# Patient Record
Sex: Male | Born: 1975
Health system: Southern US, Community
[De-identification: ages and names within clinical notes are randomized; demographics above are authoritative.]

## PROBLEM LIST (undated history)

## (undated) DIAGNOSIS — I10 Essential (primary) hypertension: Secondary | ICD-10-CM

## (undated) DIAGNOSIS — T7840XA Allergy, unspecified, initial encounter: Secondary | ICD-10-CM

## (undated) DIAGNOSIS — E785 Hyperlipidemia, unspecified: Secondary | ICD-10-CM

## (undated) DIAGNOSIS — J45909 Unspecified asthma, uncomplicated: Secondary | ICD-10-CM

## (undated) HISTORY — DX: Allergy, unspecified, initial encounter: T78.40XA

## (undated) HISTORY — DX: Hyperlipidemia, unspecified: E78.5

## (undated) HISTORY — DX: Essential (primary) hypertension: I10

## (undated) HISTORY — DX: Unspecified asthma, uncomplicated: J45.909

---

## 2009-06-12 ENCOUNTER — Ambulatory Visit (HOSPITAL_COMMUNITY): Admission: RE | Admit: 2009-06-12 | Discharge: 2009-06-12 | Payer: Self-pay | Admitting: Chiropractic Medicine

## 2011-01-05 IMAGING — CR DG CERVICAL SPINE COMPLETE 4+V
7 series · 7 of 7 positions shown · non-contrast
Comparison: None.

CLINICAL DATA: Neck pain.

CERVICAL SPINE - COMPLETE 4+ VIEW

[w c-spine lat]
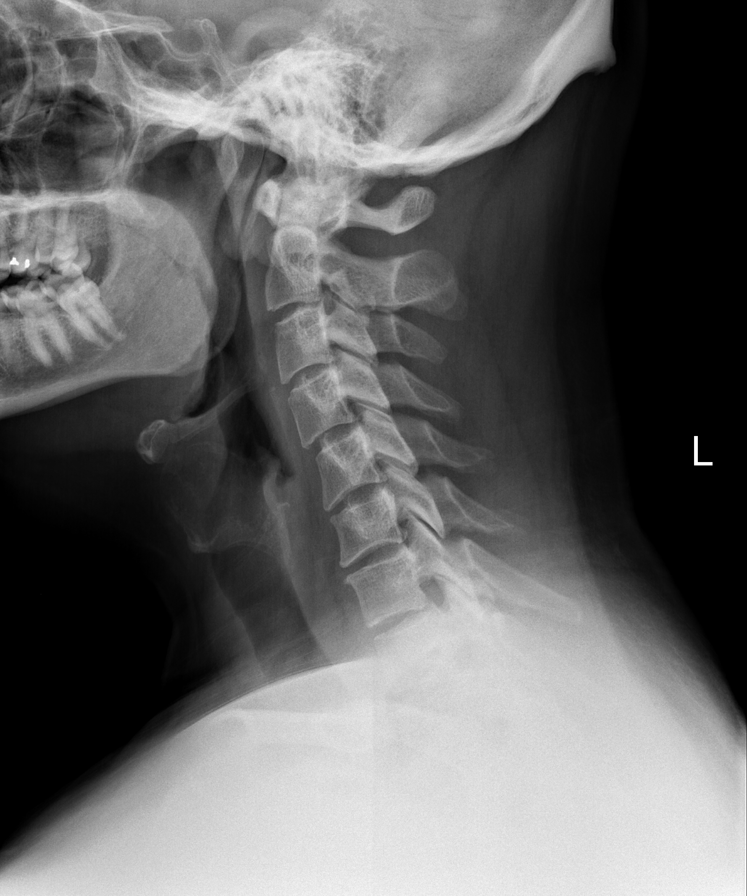

[w c-spine oblique (1 of 2)]
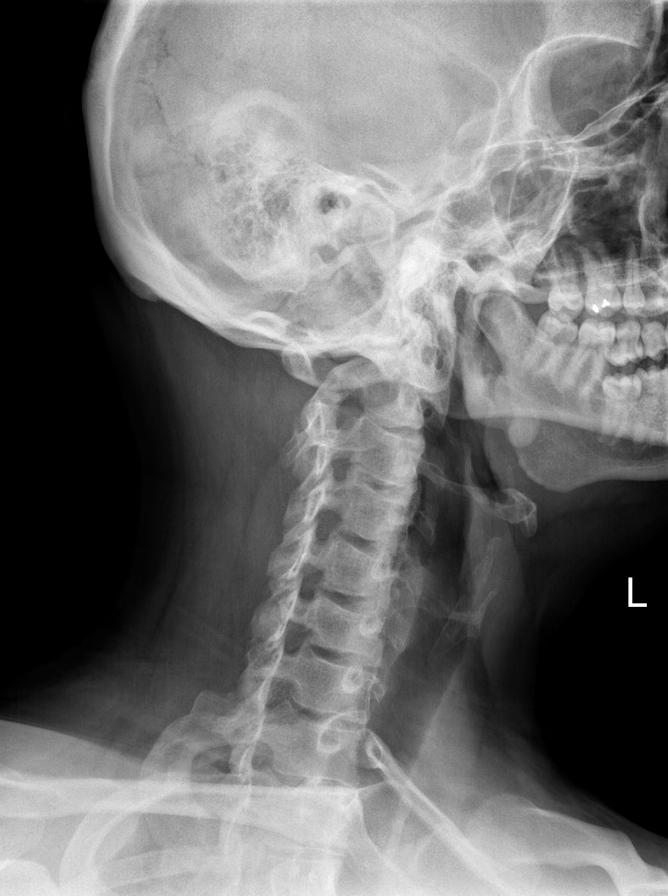

[w c-spine oblique (2 of 2)]
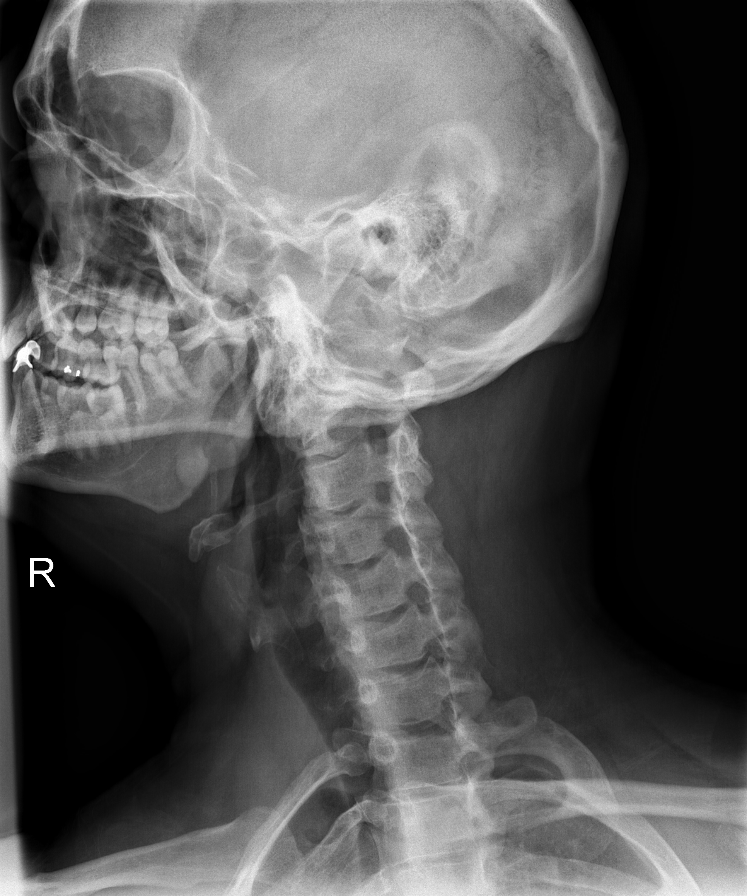

[w c-spine a.p. *]
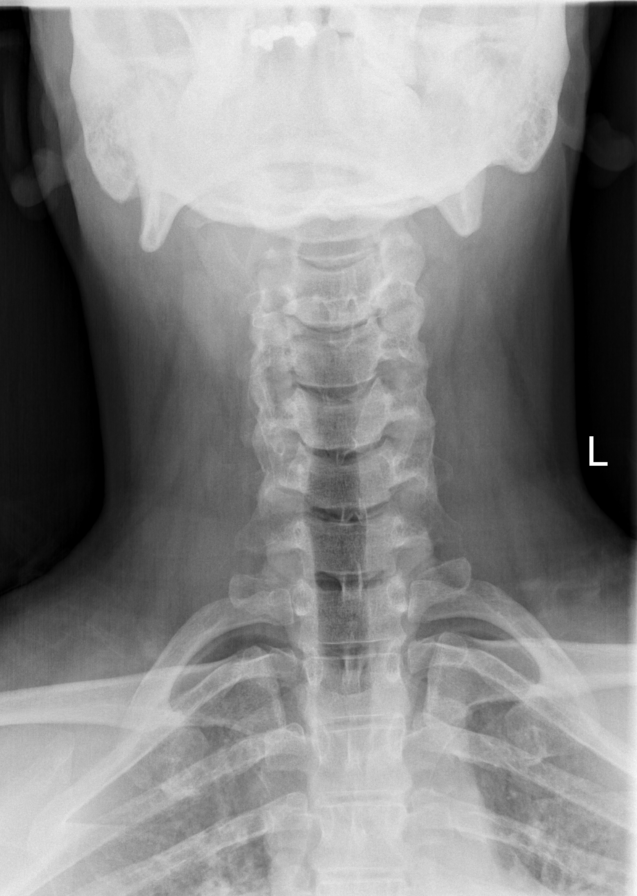

[w c-spine odontoid * (1 of 2)]
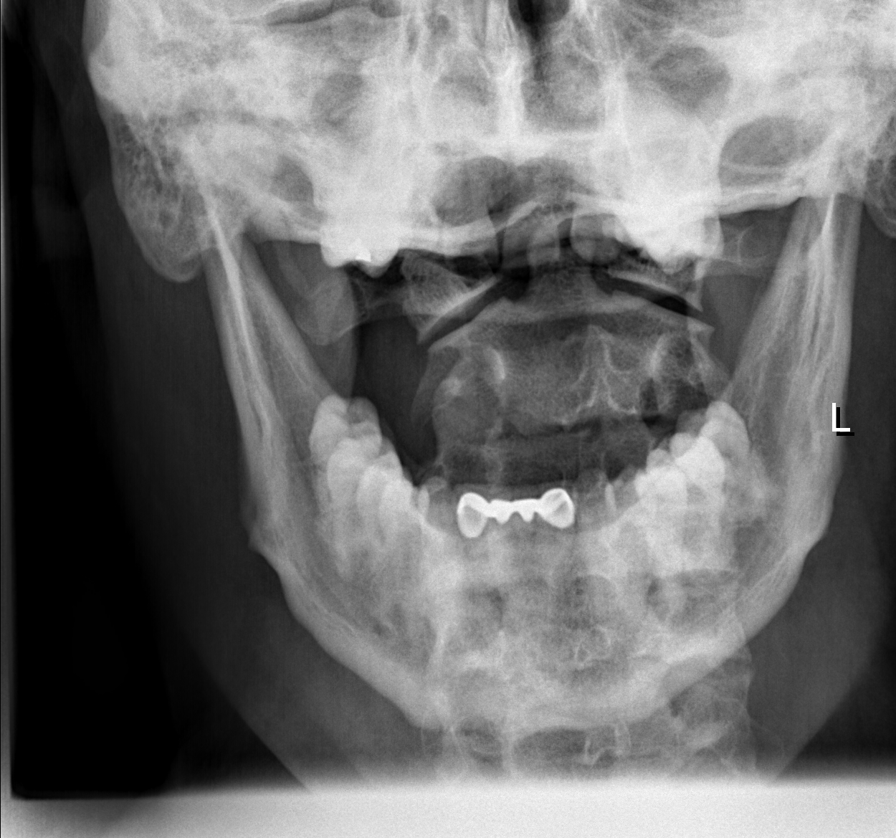

[w c-spine odontoid * (2 of 2)]
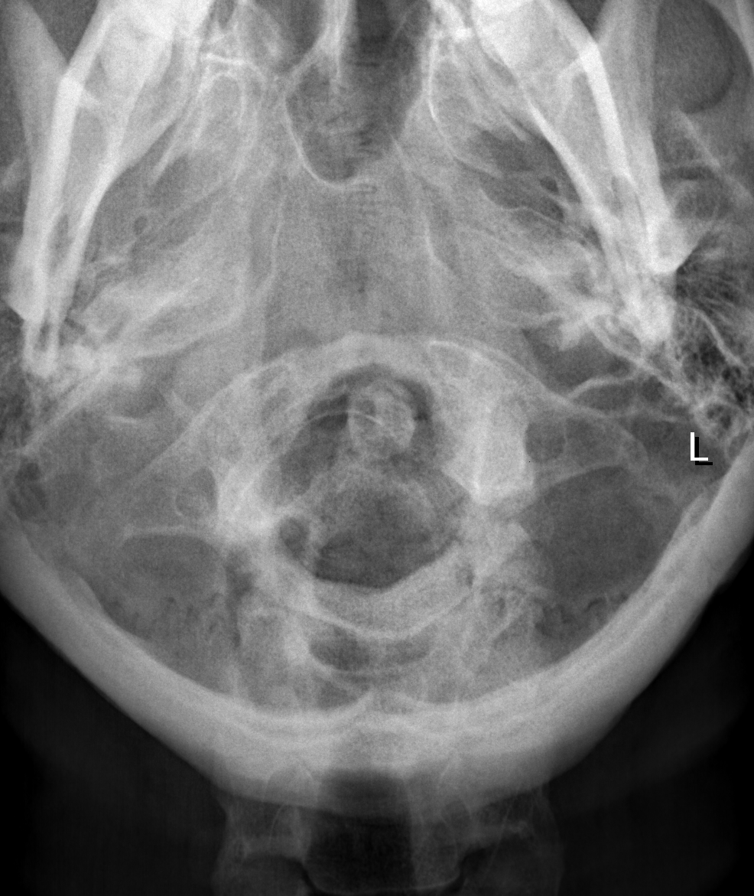

[w swimmers view *]
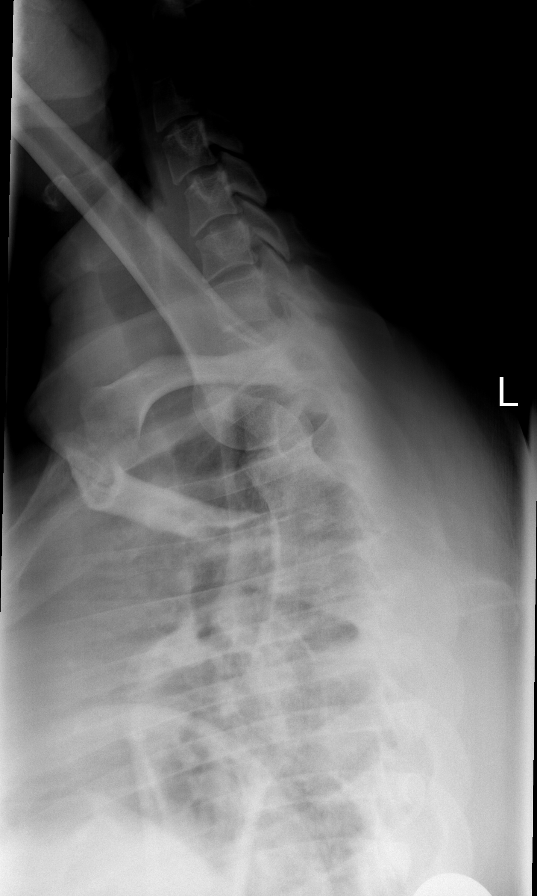

[7 of 7 positions shown; findings below may reference images not displayed]

FINDINGS: Cervical spine is visualized from the occiput to the C7-
T1 junction.  There is straightening of the normal cervical
lordosis.  Prevertebral soft tissues are within normal limits.  No
subluxation or fracture.  Mild multilevel uncovertebral hypertrophy
and facet sclerosis.  Mild loss of disc space height is seen at
multiple levels as well.  Visualized neural foramina appear patent.
Dens is partially obscured on the dedicated views.
IMPRESSION: Straightening of the normal cervical lordosis with mild
spondylosis.

## 2012-02-03 ENCOUNTER — Other Ambulatory Visit: Payer: Self-pay | Admitting: Physician Assistant

## 2012-02-05 ENCOUNTER — Other Ambulatory Visit: Payer: Self-pay | Admitting: Physician Assistant

## 2012-03-02 ENCOUNTER — Other Ambulatory Visit: Payer: Self-pay | Admitting: Family Medicine

## 2012-03-29 ENCOUNTER — Other Ambulatory Visit: Payer: Self-pay | Admitting: Family Medicine

## 2012-05-06 ENCOUNTER — Other Ambulatory Visit: Payer: Self-pay | Admitting: Physician Assistant

## 2012-07-14 ENCOUNTER — Ambulatory Visit (INDEPENDENT_AMBULATORY_CARE_PROVIDER_SITE_OTHER): Payer: Commercial Managed Care - PPO | Admitting: Family Medicine

## 2012-07-14 ENCOUNTER — Encounter: Payer: Self-pay | Admitting: Family Medicine

## 2012-07-14 VITALS — BP 121/85 | HR 76 | Temp 98.2°F | Resp 16 | Ht 66.5 in | Wt 240.0 lb

## 2012-07-14 DIAGNOSIS — I10 Essential (primary) hypertension: Secondary | ICD-10-CM | POA: Insufficient documentation

## 2012-07-14 DIAGNOSIS — E785 Hyperlipidemia, unspecified: Secondary | ICD-10-CM

## 2012-07-14 DIAGNOSIS — J45909 Unspecified asthma, uncomplicated: Secondary | ICD-10-CM

## 2012-07-14 DIAGNOSIS — Z23 Encounter for immunization: Secondary | ICD-10-CM

## 2012-07-14 DIAGNOSIS — J309 Allergic rhinitis, unspecified: Secondary | ICD-10-CM

## 2012-07-14 LAB — CBC
HCT: 46.9 % (ref 39.0–52.0)
Hemoglobin: 16.4 g/dL (ref 13.0–17.0)
MCH: 30.8 pg (ref 26.0–34.0)
MCHC: 35 g/dL (ref 30.0–36.0)
MCV: 88.2 fL (ref 78.0–100.0)
Platelets: 192 10*3/uL (ref 150–400)
RBC: 5.32 MIL/uL (ref 4.22–5.81)
RDW: 13.3 % (ref 11.5–15.5)
WBC: 4.5 10*3/uL (ref 4.0–10.5)

## 2012-07-14 LAB — COMPREHENSIVE METABOLIC PANEL
ALT: 50 U/L (ref 0–53)
AST: 35 U/L (ref 0–37)
Albumin: 4.5 g/dL (ref 3.5–5.2)
Alkaline Phosphatase: 57 U/L (ref 39–117)
BUN: 12 mg/dL (ref 6–23)
CO2: 27 mEq/L (ref 19–32)
Calcium: 9.4 mg/dL (ref 8.4–10.5)
Chloride: 105 mEq/L (ref 96–112)
Creat: 1.08 mg/dL (ref 0.50–1.35)
Glucose, Bld: 85 mg/dL (ref 70–99)
Potassium: 3.9 mEq/L (ref 3.5–5.3)
Sodium: 141 mEq/L (ref 135–145)
Total Bilirubin: 0.9 mg/dL (ref 0.3–1.2)
Total Protein: 6.7 g/dL (ref 6.0–8.3)

## 2012-07-14 LAB — LIPID PANEL
Cholesterol: 174 mg/dL (ref 0–200)
HDL: 47 mg/dL (ref >39)
LDL Cholesterol: 113 mg/dL — ABNORMAL HIGH (ref 0–99)
Total CHOL/HDL Ratio: 3.7 Ratio
Triglycerides: 71 mg/dL (ref <150)
VLDL: 14 mg/dL (ref 0–40)

## 2012-07-14 MED ORDER — FLUTICASONE-SALMETEROL 100-50 MCG/DOSE IN AEPB: 1.0000 | INHALATION_SPRAY | Freq: Two times a day (BID) | RESPIRATORY_TRACT | Status: DC

## 2012-07-14 MED ORDER — ALBUTEROL SULFATE HFA 108 (90 BASE) MCG/ACT IN AERS: 2.0000 | INHALATION_SPRAY | Freq: Four times a day (QID) | RESPIRATORY_TRACT | Status: DC | PRN

## 2012-07-14 MED ORDER — PRAVASTATIN SODIUM 20 MG PO TABS: 20.0000 mg | ORAL_TABLET | Freq: Every day | ORAL | Status: DC

## 2012-07-14 MED ORDER — PNEUMOCOCCAL VAC POLYVALENT 25 MCG/0.5ML IJ INJ: 0.5000 mL | INJECTION | INTRAMUSCULAR | Status: DC

## 2012-07-14 MED ORDER — AMLODIPINE BESYLATE 5 MG PO TABS: 5.0000 mg | ORAL_TABLET | Freq: Every day | ORAL | Status: DC

## 2012-07-14 MED ORDER — TRIAMCINOLONE ACETONIDE(NASAL) 55 MCG/ACT NA INHA: 2.0000 | Freq: Every day | NASAL | Status: DC

## 2012-07-14 NOTE — Progress Notes (Signed)
36 yo man   Lost 12 lbs since July:  1800 calories per day and walking 5 miles daily with wife.   Breathing: needs rescue inhaler (used it three times this summer).  Generally, the Advair is very effective.   Gets flu shot at hospital   Does get some edema with the amlodipine but the exercise is helping mobilize the fluid.  Blood pressure is controlled at home.  Objective:  NAD Peak flow 570 HEENT:  Unremarkable, including fundi Neck:  No adenopathy Chest: faint wheeze heard at first, clears after lying down and sitting back up Heart: reg, no murmur Abdomen: soft, nontender without HSM or mass.   Assessment: doing well. The exercising is especially effective with the weight loss.  1. Asthma  albuterol (PROVENTIL HFA;VENTOLIN HFA) 108 (90 BASE) MCG/ACT inhaler, Fluticasone-Salmeterol (ADVAIR DISKUS) 100-50 MCG/DOSE AEPB, pneumococcal 23 valent vaccine (PNU-IMMUNE) injection 0.5 mL  2. Allergic rhinitis  triamcinolone (NASACORT) 55 MCG/ACT nasal inhaler  3. Hyperlipidemia  pravastatin (PRAVACHOL) 20 MG tablet, Comprehensive metabolic panel, Lipid panel, CBC  4. Hypertension  amLODipine (NORVASC) 5 MG tablet   [email or web address]

## 2012-07-14 NOTE — Patient Instructions (Addendum)

## 2012-07-15 ENCOUNTER — Telehealth: Payer: Self-pay | Admitting: Family Medicine

## 2012-07-15 NOTE — Telephone Encounter (Signed)
Spoke with patient on phone about normal labs

## 2012-09-14 ENCOUNTER — Other Ambulatory Visit: Payer: Self-pay | Admitting: Family Medicine

## 2012-09-29 ENCOUNTER — Other Ambulatory Visit: Payer: Self-pay | Admitting: Family Medicine

## 2013-01-12 ENCOUNTER — Encounter: Payer: Self-pay | Admitting: Family Medicine

## 2013-01-12 ENCOUNTER — Ambulatory Visit (INDEPENDENT_AMBULATORY_CARE_PROVIDER_SITE_OTHER): Payer: Commercial Managed Care - PPO | Admitting: Family Medicine

## 2013-01-12 VITALS — BP 114/80 | HR 69 | Temp 97.1°F | Resp 16 | Ht 66.5 in | Wt 227.0 lb

## 2013-01-12 DIAGNOSIS — E785 Hyperlipidemia, unspecified: Secondary | ICD-10-CM

## 2013-01-12 DIAGNOSIS — J45909 Unspecified asthma, uncomplicated: Secondary | ICD-10-CM

## 2013-01-12 DIAGNOSIS — J309 Allergic rhinitis, unspecified: Secondary | ICD-10-CM

## 2013-01-12 LAB — LIPID PANEL
Cholesterol: 191 mg/dL (ref 0–200)
HDL: 51 mg/dL (ref >39)
LDL Cholesterol: 120 mg/dL — ABNORMAL HIGH (ref 0–99)
Total CHOL/HDL Ratio: 3.7 Ratio
Triglycerides: 99 mg/dL (ref <150)
VLDL: 20 mg/dL (ref 0–40)

## 2013-01-12 LAB — COMPREHENSIVE METABOLIC PANEL
ALT: 33 U/L (ref 0–53)
AST: 30 U/L (ref 0–37)
Albumin: 4.4 g/dL (ref 3.5–5.2)
Alkaline Phosphatase: 59 U/L (ref 39–117)
BUN: 11 mg/dL (ref 6–23)
CO2: 29 mEq/L (ref 19–32)
Calcium: 9.5 mg/dL (ref 8.4–10.5)
Chloride: 102 mEq/L (ref 96–112)
Creat: 1.03 mg/dL (ref 0.50–1.35)
Glucose, Bld: 98 mg/dL (ref 70–99)
Potassium: 4.4 mEq/L (ref 3.5–5.3)
Sodium: 137 mEq/L (ref 135–145)
Total Bilirubin: 1.1 mg/dL (ref 0.3–1.2)
Total Protein: 6.9 g/dL (ref 6.0–8.3)

## 2013-01-12 MED ORDER — ALBUTEROL SULFATE HFA 108 (90 BASE) MCG/ACT IN AERS: 2.0000 | INHALATION_SPRAY | Freq: Four times a day (QID) | RESPIRATORY_TRACT | Status: DC | PRN

## 2013-01-12 MED ORDER — FLUTICASONE-SALMETEROL 100-50 MCG/DOSE IN AEPB: 1.0000 | INHALATION_SPRAY | Freq: Two times a day (BID) | RESPIRATORY_TRACT | Status: DC

## 2013-01-12 MED ORDER — PRAVASTATIN SODIUM 20 MG PO TABS: 20.0000 mg | ORAL_TABLET | Freq: Every day | ORAL | Status: DC

## 2013-01-12 MED ORDER — TRIAMCINOLONE ACETONIDE(NASAL) 55 MCG/ACT NA INHA: 2.0000 | Freq: Every day | NASAL | Status: DC

## 2013-01-12 NOTE — Progress Notes (Signed)
Patient ID: Christopher Hahn MRN: [id number], DOB: 03/15/1976, 37 y.o. Date of Encounter: 01/12/2013, 8:17 AM  Primary Physician: No primary provider on file.  Chief Complaint: HTN  HPI: 37 y.o. year old male with history below presents for hypertension follow up.  Doing pilates.  Lost weight! Asthma:  Well controlled on the advair ([place], [place], dry heat).  Only uses albuterol rarely now.  No CP, HA, visual changes, or focal deficits.   Past Medical History  Diagnosis Date  . Allergy   . Asthma   . Hypertension   . Hyperlipidemia      Home Meds: Prior to Admission medications   Medication Sig Start Date End Date Taking? Authorizing Provider  albuterol (PROVENTIL HFA;VENTOLIN HFA) 108 (90 BASE) MCG/ACT inhaler Inhale 2 puffs into the lungs every 6 (six) hours as needed. 01/12/13  Yes Elvina Sidle, MD  Fluticasone-Salmeterol (ADVAIR DISKUS) 100-50 MCG/DOSE AEPB Inhale 1 puff into the lungs 2 (two) times daily. 01/12/13  Yes Elvina Sidle, MD  pravastatin (PRAVACHOL) 20 MG tablet Take 1 tablet (20 mg total) by mouth daily. 01/12/13  Yes Elvina Sidle, MD  triamcinolone (NASACORT) 55 MCG/ACT nasal inhaler Place 2 sprays into the nose daily. 01/12/13  Yes Elvina Sidle, MD    Allergies:  Allergies  Allergen Reactions  . Penicillins Anaphylaxis    History   Social History  . Marital Status: Single    Spouse Name: N/A    Number of Children: N/A  . Years of Education: N/A   Occupational History  . Not on file.   Social History Main Topics  . Smoking status: Never Smoker   . Smokeless tobacco: Not on file  . Alcohol Use: Not on file  . Drug Use: Not on file  . Sexually Active: Not on file   Other Topics Concern  . Not on file   Social History Narrative  . No narrative on file     Family History  Problem Relation Age of Onset  . Heart disease Father   . Hypertension Brother     Review of Systems: Constitutional: negative for chills, fever,  night sweats, weight changes, or fatigue  HEENT: negative for vision changes, hearing loss, congestion, rhinorrhea, ST, epistaxis, or sinus pressure Cardiovascular: negative for chest pain, palpitations, or DOE Respiratory: negative for hemoptysis, wheezing, shortness of breath, or cough Abdominal: negative for abdominal pain, nausea, vomiting, diarrhea, or constipation Dermatological: negative for rash Neurologic: negative for headache, dizziness, or syncope All other systems reviewed and are otherwise negative with the exception to those above and in the HPI.   Physical Exam: Blood pressure 114/80, pulse 69, temperature 97.1 F (36.2 C), temperature source Oral, resp. rate 16, height 5' 6.5" (1.689 m), weight 227 lb (102.967 kg)., Body mass index is 36.09 kg/(m^2). General: Well developed, well nourished, in no acute distress. Head: Normocephalic, atraumatic, eyes without discharge, sclera non-icteric, nares are without discharge. Bilateral auditory canals clear, TM's are without perforation, pearly grey and translucent with reflective cone of light bilaterally. Oral cavity moist, posterior pharynx without exudate, erythema, peritonsillar abscess, or post nasal drip.  Neck: Supple. No thyromegaly. Full ROM. No lymphadenopathy. No carotid bruits. Lungs: Clear bilaterally to auscultation without wheezes, rales, or rhonchi. Breathing is unlabored. Heart: RRR with S1 S2. No murmurs, rubs, or gallops appreciated.  Abdomen: Soft, non-tender, non-distended with normoactive bowel sounds. No hepatosplenomegaly. No rebound/guarding. No obvious abdominal masses. Msk:  Strength and tone normal for age. Extremities/Skin: Warm and dry.  No clubbing or cyanosis. No edema. No rashes or suspicious lesions. Distal pulses 2+ and equal bilaterally. Neuro: Alert and oriented X 3. Moves all extremities spontaneously. Gait is normal. CNII-XII grossly in tact. DTR 2+, cerebellar function intact. Rhomberg  normal. Psych:  Responds to questions appropriately with a normal affect.    CMP pending  ASSESSMENT AND PLAN:  37 y.o. year old male with past htn, now seems resolved.  Asthma controlled.  Hyperlipidemia to be determined.  No adverse effects from meds Asthma - Plan: albuterol (PROVENTIL HFA;VENTOLIN HFA) 108 (90 BASE) MCG/ACT inhaler, Fluticasone-Salmeterol (ADVAIR DISKUS) 100-50 MCG/DOSE AEPB  Hyperlipidemia - Plan: pravastatin (PRAVACHOL) 20 MG tablet, Comprehensive metabolic panel, Lipid panel  Allergic rhinitis - Plan: triamcinolone (NASACORT) 55 MCG/ACT nasal inhaler   -  Signed, Elvina Sidle, MD 01/12/2013 8:17 AM

## 2013-01-18 NOTE — Progress Notes (Signed)
Received denial for prior auth of Advair. Dr Milus Glazier asked me to appeal the decision. Faxed appeal letter to [employer] for expedited appeal.

## 2013-01-23 ENCOUNTER — Telehealth: Payer: Self-pay

## 2013-01-23 MED ORDER — MOMETASONE FURO-FORMOTEROL FUM 100-5 MCG/ACT IN AERO: 2.0000 | INHALATION_SPRAY | RESPIRATORY_TRACT | Status: DC

## 2013-01-23 NOTE — Telephone Encounter (Signed)
Thanks, left message for him to advise.

## 2013-01-23 NOTE — Telephone Encounter (Signed)
dulera should be covered, can we change? pended

## 2013-01-23 NOTE — Telephone Encounter (Signed)
PT STATES HE RECEIVED A LETTER STATING HIS INSURANCE WILL NO LONGER COVER THE ADVAIR AND WOULD LIKE TO KNOW WHAT WILL THEY COVER THAT'S IN THE FAMILY. PLEASE CALL [phone]

## 2013-01-23 NOTE — Telephone Encounter (Signed)
Medication changed and sent in

## 2013-01-26 NOTE — Progress Notes (Signed)
Received denial of appeal. Rx for [facility] already sent in.

## 2013-08-31 ENCOUNTER — Other Ambulatory Visit: Payer: Self-pay

## 2014-01-11 ENCOUNTER — Other Ambulatory Visit: Payer: Self-pay | Admitting: Radiology

## 2014-01-11 ENCOUNTER — Ambulatory Visit (INDEPENDENT_AMBULATORY_CARE_PROVIDER_SITE_OTHER): Payer: Commercial Managed Care - PPO | Admitting: Family Medicine

## 2014-01-11 ENCOUNTER — Encounter: Payer: Self-pay | Admitting: Family Medicine

## 2014-01-11 VITALS — BP 128/100 | HR 70 | Temp 98.4°F | Resp 16 | Ht 67.0 in | Wt 229.0 lb

## 2014-01-11 DIAGNOSIS — I10 Essential (primary) hypertension: Secondary | ICD-10-CM

## 2014-01-11 DIAGNOSIS — E785 Hyperlipidemia, unspecified: Secondary | ICD-10-CM

## 2014-01-11 DIAGNOSIS — J45909 Unspecified asthma, uncomplicated: Secondary | ICD-10-CM

## 2014-01-11 DIAGNOSIS — J309 Allergic rhinitis, unspecified: Secondary | ICD-10-CM

## 2014-01-11 LAB — POCT URINALYSIS DIPSTICK
Bilirubin, UA: NEGATIVE
Blood, UA: NEGATIVE
Glucose, UA: NEGATIVE
Ketones, UA: NEGATIVE
Leukocytes, UA: NEGATIVE
Nitrite, UA: NEGATIVE
Protein, UA: NEGATIVE
Spec Grav, UA: 1.01
Urobilinogen, UA: 0.2
pH, UA: 7

## 2014-01-11 LAB — CBC WITH DIFFERENTIAL/PLATELET
Basophils Absolute: 0.1 10*3/uL (ref 0.0–0.1)
Basophils Relative: 1 % (ref 0–1)
Eosinophils Absolute: 0.6 10*3/uL (ref 0.0–0.7)
Eosinophils Relative: 10 % — ABNORMAL HIGH (ref 0–5)
HCT: 49.2 % (ref 39.0–52.0)
Hemoglobin: 17.4 g/dL — ABNORMAL HIGH (ref 13.0–17.0)
Lymphocytes Relative: 24 % (ref 12–46)
Lymphs Abs: 1.3 10*3/uL (ref 0.7–4.0)
MCH: 31.1 pg (ref 26.0–34.0)
MCHC: 35.4 g/dL (ref 30.0–36.0)
MCV: 88 fL (ref 78.0–100.0)
Monocytes Absolute: 0.4 10*3/uL (ref 0.1–1.0)
Monocytes Relative: 8 % (ref 3–12)
Neutro Abs: 3.2 10*3/uL (ref 1.7–7.7)
Neutrophils Relative %: 57 % (ref 43–77)
Platelets: 195 10*3/uL (ref 150–400)
RBC: 5.59 MIL/uL (ref 4.22–5.81)
RDW: 14 % (ref 11.5–15.5)
WBC: 5.6 10*3/uL (ref 4.0–10.5)

## 2014-01-11 LAB — COMPREHENSIVE METABOLIC PANEL
ALT: 26 U/L (ref 0–53)
AST: 24 U/L (ref 0–37)
Albumin: 4.7 g/dL (ref 3.5–5.2)
Alkaline Phosphatase: 54 U/L (ref 39–117)
BUN: 13 mg/dL (ref 6–23)
CO2: 27 mEq/L (ref 19–32)
Calcium: 10 mg/dL (ref 8.4–10.5)
Chloride: 103 mEq/L (ref 96–112)
Creat: 1.03 mg/dL (ref 0.50–1.35)
Glucose, Bld: 96 mg/dL (ref 70–99)
Potassium: 4.4 mEq/L (ref 3.5–5.3)
Sodium: 139 mEq/L (ref 135–145)
Total Bilirubin: 1.1 mg/dL (ref 0.2–1.2)
Total Protein: 6.9 g/dL (ref 6.0–8.3)

## 2014-01-11 LAB — LIPID PANEL
Cholesterol: 235 mg/dL — ABNORMAL HIGH (ref 0–200)
HDL: 48 mg/dL (ref >39)
LDL Cholesterol: 169 mg/dL — ABNORMAL HIGH (ref 0–99)
Total CHOL/HDL Ratio: 4.9 Ratio
Triglycerides: 89 mg/dL (ref <150)
VLDL: 18 mg/dL (ref 0–40)

## 2014-01-11 MED ORDER — TRIAMCINOLONE ACETONIDE 55 MCG/ACT NA AERO: 2.0000 | INHALATION_SPRAY | Freq: Every day | NASAL | Status: DC

## 2014-01-11 MED ORDER — MOMETASONE FURO-FORMOTEROL FUM 100-5 MCG/ACT IN AERO: 2.0000 | INHALATION_SPRAY | RESPIRATORY_TRACT | Status: DC

## 2014-01-11 MED ORDER — ALBUTEROL SULFATE HFA 108 (90 BASE) MCG/ACT IN AERS: 2.0000 | INHALATION_SPRAY | Freq: Four times a day (QID) | RESPIRATORY_TRACT | Status: DC | PRN

## 2014-01-11 MED ORDER — MOMETASONE FURO-FORMOTEROL FUM 100-5 MCG/ACT IN AERO: 2.0000 | INHALATION_SPRAY | Freq: Every day | RESPIRATORY_TRACT | Status: DC

## 2014-01-11 MED ORDER — AMLODIPINE BESYLATE 5 MG PO TABS: 5.0000 mg | ORAL_TABLET | Freq: Every day | ORAL | Status: DC

## 2014-01-11 NOTE — Patient Instructions (Signed)
Asthma, Adult Asthma is a recurring condition in which the airways tighten and narrow. Asthma can make it difficult to breathe. It can cause coughing, wheezing, and shortness of breath. Asthma episodes (also called asthma attacks) range from minor to life-threatening. Asthma cannot be cured, but medicines and lifestyle changes can help control it. CAUSES Asthma is believed to be caused by inherited (genetic) and environmental factors, but its exact cause is unknown. Asthma may be triggered by allergens, lung infections, or irritants in the air. Asthma triggers are different for each person. Common triggers include:   Animal dander.  Dust mites.  Cockroaches.  Pollen from trees or grass.  Mold.  Smoke.  Air pollutants such as dust, household cleaners, hair sprays, aerosol sprays, paint fumes, strong chemicals, or strong odors.  Cold air, weather changes, and winds (which increase molds and pollens in the air).  Strong emotional expressions such as crying or laughing hard.  Stress.  Certain medicines (such as aspirin) or types of drugs (such as beta-blockers).  Sulfites in foods and drinks. Foods and drinks that may contain sulfites include dried fruit, potato chips, and sparkling grape juice.  Infections or inflammatory conditions such as the flu, a cold, or an inflammation of the nasal membranes (rhinitis).  Gastroesophageal reflux disease (GERD).  Exercise or strenuous activity. SYMPTOMS Symptoms may occur immediately after asthma is triggered or many hours later. Symptoms include:  Wheezing.  Excessive nighttime or early morning coughing.  Frequent or severe coughing with a common cold.  Chest tightness.  Shortness of breath. DIAGNOSIS  The diagnosis of asthma is made by a review of your medical history and a physical exam. Tests may also be performed. These may include:  Lung function studies. These tests show how much air you breath in and out.  Allergy  tests.  Imaging tests such as X-rays. TREATMENT  Asthma cannot be cured, but it can usually be controlled. Treatment involves identifying and avoiding your asthma triggers. It also involves medicines. There are 2 classes of medicine used for asthma treatment:   Controller medicines. These prevent asthma symptoms from occurring. They are usually taken every day.  Reliever or rescue medicines. These quickly relieve asthma symptoms. They are used as needed and provide short-term relief. Your health care provider will help you create an asthma action plan. An asthma action plan is a written plan for managing and treating your asthma attacks. It includes a list of your asthma triggers and how they may be avoided. It also includes information on when medicines should be taken and when their dosage should be changed. An action plan may also involve the use of a device called a peak flow meter. A peak flow meter measures how well the lungs are working. It helps you monitor your condition. HOME CARE INSTRUCTIONS   Take medicine as directed by your health care provider. Speak with your health care provider if you have questions about how or when to take the medicines.  Use a peak flow meter as directed by your health care provider. Record and keep track of readings.  Understand and use the action plan to help minimize or stop an asthma attack without needing to seek medical care.  Control your home environment in the following ways to help prevent asthma attacks:  Do not smoke. Avoid being exposed to secondhand smoke.  Change your heating and air conditioning filter regularly.  Limit your use of fireplaces and wood stoves.  Get rid of pests (such as roaches and   mice) and their droppings.  Throw away plants if you see mold on them.  Clean your floors and dust regularly. Use unscented cleaning products.  Try to have someone else vacuum for you regularly. Stay out of rooms while they are being  vacuumed and for a short while afterward. If you vacuum, use a dust mask from a hardware store, a double-layered or microfilter vacuum cleaner bag, or a vacuum cleaner with a HEPA filter.  Replace carpet with wood, tile, or vinyl flooring. Carpet can trap dander and dust.  Use allergy-proof pillows, mattress covers, and box spring covers.  Wash bed sheets and blankets every week in hot water and dry them in a dryer.  Use blankets that are made of polyester or cotton.  Clean bathrooms and kitchens with bleach. If possible, have someone repaint the walls in these rooms with mold-resistant paint. Keep out of the rooms that are being cleaned and painted.  Wash hands frequently. SEEK MEDICAL CARE IF:   You have wheezing, shortness of breath, or a cough even if taking medicine to prevent attacks.  The colored mucus you cough up (sputum) is thicker than usual.  Your sputum changes from clear or white to yellow, green, gray, or bloody.  You have any problems that may be related to the medicines you are taking (such as a rash, itching, swelling, or trouble breathing).  You are using a reliever medicine more than 2 3 times per week.  Your peak flow is still at 50 79% of you personal best after following your action plan for 1 hour. SEEK IMMEDIATE MEDICAL CARE IF:   You seem to be getting worse and are unresponsive to treatment during an asthma attack.  You are short of breath even at rest.  You get short of breath when doing very little physical activity.  You have difficulty eating, drinking, or talking due to asthma symptoms.  You develop chest pain.  You develop a fast heartbeat.  You have a bluish color to your lips or fingernails.  You are lightheaded, dizzy, or faint.  Your peak flow is less than 50% of your personal best.  You have a fever or persistent symptoms for more than 2 3 days.  You have a fever and symptoms suddenly get worse. MAKE SURE YOU:   Understand these  instructions.  Will watch your condition.  Will get help right away if you are not doing well or get worse. Document Released: 10/12/2005 Document Revised: 06/14/2013 Document Reviewed: 05/11/2013 [facility] Patient Information 2014 [place], [place]. Hypertension As your heart beats, it forces blood through your arteries. This force is your blood pressure. If the pressure is too high, it is called hypertension (HTN) or high blood pressure. HTN is dangerous because you may have it and not know it. High blood pressure may mean that your heart has to work harder to pump blood. Your arteries may be narrow or stiff. The extra work puts you at risk for heart disease, stroke, and other problems.  Blood pressure consists of two numbers, a higher number over a lower, 110/72, for example. It is stated as "110 over 72." The ideal is below 120 for the top number (systolic) and under 80 for the bottom (diastolic). Write down your blood pressure today. You should pay close attention to your blood pressure if you have certain conditions such as:  Heart failure.  Prior heart attack.  Diabetes  Chronic kidney disease.  Prior stroke.  Multiple risk factors for heart disease.  To see if you have HTN, your blood pressure should be measured while you are seated with your arm held at the level of the heart. It should be measured at least twice. A one-time elevated blood pressure reading (especially in the Emergency Department) does not mean that you need treatment. There may be conditions in which the blood pressure is different between your right and left arms. It is important to see your caregiver soon for a recheck. Most people have essential hypertension which means that there is not a specific cause. This type of high blood pressure may be lowered by changing lifestyle factors such as:  Stress.  Smoking.  Lack of exercise.  Excessive weight.  Drug/tobacco/alcohol use.  Eating less salt. Most people  do not have symptoms from high blood pressure until it has caused damage to the body. Effective treatment can often prevent, delay or reduce that damage. TREATMENT  When a cause has been identified, treatment for high blood pressure is directed at the cause. There are a large number of medications to treat HTN. These fall into several categories, and your caregiver will help you select the medicines that are best for you. Medications may have side effects. You should review side effects with your caregiver. If your blood pressure stays high after you have made lifestyle changes or started on medicines,   Your medication(s) may need to be changed.  Other problems may need to be addressed.  Be certain you understand your prescriptions, and know how and when to take your medicine.  Be sure to follow up with your caregiver within the time frame advised (usually within two weeks) to have your blood pressure rechecked and to review your medications.  If you are taking more than one medicine to lower your blood pressure, make sure you know how and at what times they should be taken. Taking two medicines at the same time can result in blood pressure that is too low. SEEK IMMEDIATE MEDICAL CARE IF:  You develop a severe headache, blurred or changing vision, or confusion.  You have unusual weakness or numbness, or a faint feeling.  You have severe chest or abdominal pain, vomiting, or breathing problems. MAKE SURE YOU:   Understand these instructions.  Will watch your condition.  Will get help right away if you are not doing well or get worse. Document Released: 10/12/2005 Document Revised: 01/04/2012 Document Reviewed: 06/01/2008 [facility] Patient Information 2014 [place], [place].

## 2014-01-11 NOTE — Progress Notes (Signed)
Patient ID: Christopher Hahn MRN: [id number], DOB: 1976/04/27, 38 y.o. Date of Encounter: 01/11/2014, 8:05 AM  Primary Physician: No primary provider on file.  Chief Complaint: HTN  HPI: 38 y.o. year old male with history below presents for hypertension and hyperlipidemia follow up. Working on Recruitment consultantpremed requirements.   [facility] [employer]. Wife teaches English Less exercise Breathing doing well.  Stayed healthy over winter. No CP, HA, visual changes, or focal deficits.   Past Medical History  Diagnosis Date  . Allergy   . Asthma   . Hypertension   . Hyperlipidemia      Home Meds: Prior to Admission medications   Medication Sig Start Date End Date Taking? Authorizing Provider  albuterol (PROVENTIL HFA;VENTOLIN HFA) 108 (90 BASE) MCG/ACT inhaler Inhale 2 puffs into the lungs every 6 (six) hours as needed. 01/12/13  Yes Elvina SidleKurt Siddarth Hsiung, MD  mometasone-formoterol [facility](DULERA) 100-5 MCG/ACT AERO Inhale 2 puffs into the lungs 1 day or 1 dose. 01/23/13  Yes Ryan M Dunn, PA-C  pravastatin (PRAVACHOL) 20 MG tablet Take 1 tablet (20 mg total) by mouth daily. 01/12/13  Yes Elvina SidleKurt Essam Lowdermilk, MD  triamcinolone (NASACORT) 55 MCG/ACT nasal inhaler Place 2 sprays into the nose daily. 01/12/13  Yes Elvina SidleKurt Jihan Rudy, MD    Allergies:  Allergies  Allergen Reactions  . Penicillins Anaphylaxis    History   Social History  . Marital Status: Single    Spouse Name: N/A    Number of Children: N/A  . Years of Education: N/A   Occupational History  . Not on file.   Social History Main Topics  . Smoking status: Never Smoker   . Smokeless tobacco: Not on file  . Alcohol Use: Not on file  . Drug Use: Not on file  . Sexual Activity: Not on file   Other Topics Concern  . Not on file   Social History Narrative  . No narrative on file     Family History  Problem Relation Age of Onset  . Heart disease Father   . Hypertension Brother     Review of Systems: Constitutional: negative for chills, fever,  night sweats, weight changes, or fatigue  HEENT: negative for vision changes, hearing loss, congestion, rhinorrhea, ST, epistaxis, or sinus pressure Cardiovascular: negative for chest pain, palpitations, or DOE Respiratory: negative for hemoptysis, wheezing, shortness of breath, or cough Abdominal: negative for abdominal pain, nausea, vomiting, diarrhea, or constipation Dermatological: negative for rash Neurologic: negative for headache, dizziness, or syncope All other systems reviewed and are otherwise negative with the exception to those above and in the HPI.   Physical Exam:  BP 125/90 Blood pressure 128/100, pulse 70, temperature 98.4 F (36.9 C), temperature source Oral, resp. rate 16, height 5\' 7"  (1.702 m), weight 229 lb (103.874 kg), SpO2 98.00%., Body mass index is 35.86 kg/(m^2). General: Well developed, well nourished, in no acute distress. Head: Normocephalic, atraumatic, eyes without discharge, sclera non-icteric, nares are without discharge. Bilateral auditory canals clear, TM's are without perforation, pearly grey and translucent with reflective cone of light bilaterally. Oral cavity moist, posterior pharynx without exudate, erythema, peritonsillar abscess, or post nasal drip.  Neck: Supple. No thyromegaly. Full ROM. No lymphadenopathy. No carotid bruits. Lungs: Clear bilaterally to auscultation without wheezes, rales, or rhonchi. Breathing is unlabored. Heart: RRR with S1 S2. No murmurs, rubs, or gallops appreciated.  Abdomen: Soft, non-tender, non-distended with normoactive bowel sounds. No hepatosplenomegaly. No rebound/guarding. No obvious abdominal masses. Msk:  Strength and tone normal for age. Extremities/Skin: Warm  and dry. No clubbing or cyanosis. No edema. No rashes or suspicious lesions. Distal pulses 2+ and equal bilaterally. Neuro: Alert and oriented X 3. Moves all extremities spontaneously. Gait is normal. CNII-XII grossly in tact. DTR 2+, cerebellar function intact.  Rhomberg normal. Psych:  Responds to questions appropriately with a normal affect.   Labs:  CMP pending  ASSESSMENT AND PLAN:  38 y.o. year old male with hypertension. He's not able to exercise as much as he had been and feels that he will need some help with the blood pressure control.  Hyperlipidemia - Plan: Comprehensive metabolic panel, Lipid panel, POCT urinalysis dipstick, CANCELED: POCT CBC  Allergic rhinitis - Plan: triamcinolone (NASACORT AQ) 55 MCG/ACT AERO nasal inhaler  Asthma, chronic - Plan: mometasone-formoterol (DULERA) 100-5 MCG/ACT AERO  Asthma - Plan: albuterol (PROVENTIL HFA;VENTOLIN HFA) 108 (90 BASE) MCG/ACT inhaler  Hypertension - Plan: amLODipine (NORVASC) 5 MG tablet, CBC with Differential  Signed, Elvina Sidle, MD   -  Signed, Elvina Sidle, MD 01/11/2014 8:05 AM

## 2014-04-10 ENCOUNTER — Other Ambulatory Visit: Payer: Self-pay | Admitting: Family Medicine

## 2014-07-12 ENCOUNTER — Other Ambulatory Visit: Payer: Self-pay | Admitting: Family Medicine

## 2014-09-26 ENCOUNTER — Encounter: Payer: Self-pay | Admitting: Family Medicine

## 2014-09-26 ENCOUNTER — Other Ambulatory Visit: Payer: Self-pay | Admitting: Family Medicine

## 2014-09-26 DIAGNOSIS — J4531 Mild persistent asthma with (acute) exacerbation: Secondary | ICD-10-CM

## 2014-09-26 MED ORDER — FLUTICASONE-SALMETEROL 100-50 MCG/DOSE IN AEPB: 1.0000 | INHALATION_SPRAY | Freq: Two times a day (BID) | RESPIRATORY_TRACT | Status: DC

## 2014-10-11 ENCOUNTER — Other Ambulatory Visit: Payer: Self-pay | Admitting: Physician Assistant

## 2015-01-17 ENCOUNTER — Encounter: Payer: Self-pay | Admitting: Family Medicine

## 2015-01-17 ENCOUNTER — Ambulatory Visit (INDEPENDENT_AMBULATORY_CARE_PROVIDER_SITE_OTHER): Payer: 59 | Admitting: Family Medicine

## 2015-01-17 VITALS — BP 150/100 | HR 92 | Temp 98.3°F | Resp 16 | Ht 66.0 in | Wt 242.0 lb

## 2015-01-17 DIAGNOSIS — I10 Essential (primary) hypertension: Secondary | ICD-10-CM | POA: Diagnosis not present

## 2015-01-17 DIAGNOSIS — Z0000 Encounter for general adult medical examination without abnormal findings: Secondary | ICD-10-CM | POA: Diagnosis not present

## 2015-01-17 DIAGNOSIS — E785 Hyperlipidemia, unspecified: Secondary | ICD-10-CM

## 2015-01-17 LAB — CBC
HCT: 48.9 % (ref 39.0–52.0)
Hemoglobin: 17.2 g/dL — ABNORMAL HIGH (ref 13.0–17.0)
MCH: 30.8 pg (ref 26.0–34.0)
MCHC: 35.2 g/dL (ref 30.0–36.0)
MCV: 87.5 fL (ref 78.0–100.0)
MPV: 9.4 fL (ref 8.6–12.4)
Platelets: 179 10*3/uL (ref 150–400)
RBC: 5.59 MIL/uL (ref 4.22–5.81)
RDW: 13.4 % (ref 11.5–15.5)
WBC: 6.5 10*3/uL (ref 4.0–10.5)

## 2015-01-17 LAB — COMPREHENSIVE METABOLIC PANEL
ALT: 36 U/L (ref 0–53)
AST: 38 U/L — ABNORMAL HIGH (ref 0–37)
Albumin: 4.6 g/dL (ref 3.5–5.2)
Alkaline Phosphatase: 49 U/L (ref 39–117)
BUN: 14 mg/dL (ref 6–23)
CO2: 25 mEq/L (ref 19–32)
Calcium: 9.3 mg/dL (ref 8.4–10.5)
Chloride: 101 mEq/L (ref 96–112)
Creat: 1.05 mg/dL (ref 0.50–1.35)
Glucose, Bld: 68 mg/dL — ABNORMAL LOW (ref 70–99)
Potassium: 4.2 mEq/L (ref 3.5–5.3)
Sodium: 138 mEq/L (ref 135–145)
Total Bilirubin: 1.2 mg/dL (ref 0.2–1.2)
Total Protein: 6.9 g/dL (ref 6.0–8.3)

## 2015-01-17 LAB — POCT URINALYSIS DIPSTICK
Bilirubin, UA: NEGATIVE
Blood, UA: NEGATIVE
Glucose, UA: NEGATIVE
Ketones, UA: 15
Leukocytes, UA: NEGATIVE
Nitrite, UA: NEGATIVE
Protein, UA: NEGATIVE
Spec Grav, UA: 1.01
Urobilinogen, UA: 0.2
pH, UA: 6.5

## 2015-01-17 LAB — LIPID PANEL
Cholesterol: 244 mg/dL — ABNORMAL HIGH (ref 0–200)
HDL: 43 mg/dL (ref >=40)
LDL Cholesterol: 185 mg/dL — ABNORMAL HIGH (ref 0–99)
Total CHOL/HDL Ratio: 5.7 Ratio
Triglycerides: 80 mg/dL (ref <150)
VLDL: 16 mg/dL (ref 0–40)

## 2015-01-17 LAB — POCT GLYCOSYLATED HEMOGLOBIN (HGB A1C): Hemoglobin A1C: 5.1

## 2015-01-17 MED ORDER — AMLODIPINE BESYLATE 5 MG PO TABS: 5.0000 mg | ORAL_TABLET | Freq: Every day | ORAL | Status: DC

## 2015-01-17 MED ORDER — PRAVASTATIN SODIUM 20 MG PO TABS: 20.0000 mg | ORAL_TABLET | Freq: Every day | ORAL | Status: DC

## 2015-01-17 NOTE — Progress Notes (Signed)
Subjective:  This chart was scribed for Elvina SidleKurt Lauenstein, MD by Richarda Overlieichard Holland, Medical scribe. This patient was seen in ROOM 27 and the patient's care was started 2:25 PM.  Patient ID: Christopher Hahn, male    DOB: [birth year], 39 y.o.   MRN: [id number]   Chief Complaint  Patient presents with   Annual Exam   Medication Refill    amlodipine and pravastatin   HPI HPI Comments: Christopher Hahn is a 39 y.o. male with a history of HTN, asthma and hyperlipidemia who presents to [facility] for an annual exam and a medication refill of amlodipine and pravastatin. He states that he tries to stay active with pilates occasionally. He states that last time he ate was 8PM last night. Pt complains of no symptoms at this time. He states that he has not taken his pravastatin the last 2 months because he ran out of his medication. Pt has a blood pressure of 118/84 currently. He reports no modifying or alleviating factors at this time.   Patient Active Problem List   Diagnosis Date Noted   Hypertension 07/14/2012   Asthma 07/14/2012   Hyperlipidemia 07/14/2012   Past Medical History  Diagnosis Date   Allergy    Asthma    Hypertension    Hyperlipidemia    Allergies  Allergen Reactions   Penicillins Anaphylaxis   Current Outpatient Prescriptions on File Prior to Visit  Medication Sig Dispense Refill   albuterol (PROVENTIL HFA;VENTOLIN HFA) 108 (90 BASE) MCG/ACT inhaler Inhale 2 puffs into the lungs every 6 (six) hours as needed. 1 Inhaler 10   amLODipine (NORVASC) 5 MG tablet Take 1 tablet (5 mg total) by mouth daily. 90 tablet 3   mometasone-formoterol (DULERA) 100-5 MCG/ACT AERO Inhale 2 puffs into the lungs daily. 1 Inhaler 11   pravastatin (PRAVACHOL) 20 MG tablet Take 1 tablet (20 mg total) by mouth daily. PATIENT NEEDS OFFICE VISIT FOR ADDITIONAL REFILLS 30 tablet 0   triamcinolone (NASACORT AQ) 55 MCG/ACT AERO nasal inhaler Place 2 sprays into the nose daily. 1 Inhaler 12    Fluticasone-Salmeterol (ADVAIR) 100-50 MCG/DOSE AEPB Inhale 1 puff into the lungs 2 (two) times daily. (Patient not taking: Reported on 01/17/2015) 1 each 11   No current facility-administered medications on file prior to visit.   No past surgical history on file. Family History  Problem Relation Age of Onset   Heart disease Father    Hypertension Brother    History   Social History   Marital Status: Single    Spouse Name: N/A   Number of Children: N/A   Years of Education: N/A   Occupational History   Not on file.   Social History Main Topics   Smoking status: Never Smoker    Smokeless tobacco: Not on file   Alcohol Use: Not on file   Drug Use: Not on file   Sexual Activity: Not on file   Other Topics Concern   Not on file   Social History Narrative   Review of Systems  All other systems reviewed and are negative.     Objective:   Physical Exam  Constitutional: He is oriented to person, place, and time. He appears well-developed and well-nourished.  HENT:  Head: Normocephalic and atraumatic.  D and K baby teeth still present.   Eyes: Right eye exhibits no discharge. Left eye exhibits no discharge.  Neck: Neck supple. No tracheal deviation present.  Cardiovascular: Normal rate.   Pulmonary/Chest: Effort normal. No respiratory  distress.  Abdominal: He exhibits no distension.  Neurological: He is alert and oriented to person, place, and time.  Skin: Skin is warm and dry.  Psychiatric: He has a normal mood and affect. His behavior is normal.  Nursing note and vitals reviewed.  Filed Vitals:   01/17/15 1359  BP: 150/100  Pulse: 92  Temp: 98.3 F (36.8 C)  TempSrc: Oral  Resp: 16  Height:  (1.676 m)  Weight: 242 lb (109.77 kg)  SpO2: 98%      Assessment & Plan:   This chart was scribed in my presence and reviewed by me personally.    ICD-9-CM ICD-10-CM   1. Annual physical exam V70.0 Z00.00 POCT urinalysis dipstick     POCT  glycosylated hemoglobin (Hb A1C)     Comprehensive metabolic panel     Lipid panel     CBC  2. Essential hypertension 401.9 I10 amLODipine (NORVASC) 5 MG tablet  3. Hyperlipidemia 272.4 E78.5 pravastatin (PRAVACHOL) 20 MG tablet     Signed, Elvina Sidle, MD

## 2015-01-17 NOTE — Patient Instructions (Signed)

## 2015-11-12 ENCOUNTER — Other Ambulatory Visit: Payer: Self-pay | Admitting: Family Medicine

## 2015-11-13 MED FILL — ADVAIR 100/50 DISKUS: 100-50 | 30 days supply | Qty: 60 | Fill #0

## 2016-01-02 ENCOUNTER — Encounter: Payer: Self-pay | Admitting: Physician Assistant

## 2016-01-02 ENCOUNTER — Ambulatory Visit (INDEPENDENT_AMBULATORY_CARE_PROVIDER_SITE_OTHER): Payer: 59 | Admitting: Physician Assistant

## 2016-01-02 VITALS — BP 130/90 | HR 87 | Temp 97.9°F | Resp 16 | Ht 66.0 in | Wt 237.0 lb

## 2016-01-02 DIAGNOSIS — M79609 Pain in unspecified limb: Secondary | ICD-10-CM | POA: Diagnosis not present

## 2016-01-02 DIAGNOSIS — M79675 Pain in left toe(s): Secondary | ICD-10-CM

## 2016-01-02 DIAGNOSIS — Z114 Encounter for screening for human immunodeficiency virus [HIV]: Secondary | ICD-10-CM

## 2016-01-02 DIAGNOSIS — Z1159 Encounter for screening for other viral diseases: Secondary | ICD-10-CM | POA: Diagnosis not present

## 2016-01-02 DIAGNOSIS — Z130 Encounter for screening for diseases of the blood and blood-forming organs and certain disorders involving the immune mechanism: Secondary | ICD-10-CM | POA: Diagnosis not present

## 2016-01-02 DIAGNOSIS — J4520 Mild intermittent asthma, uncomplicated: Secondary | ICD-10-CM

## 2016-01-02 DIAGNOSIS — I10 Essential (primary) hypertension: Secondary | ICD-10-CM

## 2016-01-02 DIAGNOSIS — E785 Hyperlipidemia, unspecified: Secondary | ICD-10-CM

## 2016-01-02 DIAGNOSIS — Z23 Encounter for immunization: Secondary | ICD-10-CM | POA: Diagnosis not present

## 2016-01-02 DIAGNOSIS — J45909 Unspecified asthma, uncomplicated: Secondary | ICD-10-CM | POA: Diagnosis not present

## 2016-01-02 LAB — COMPLETE METABOLIC PANEL WITH GFR
ALT: 37 U/L (ref 9–46)
AST: 34 U/L (ref 10–40)
Albumin: 4.6 g/dL (ref 3.6–5.1)
Alkaline Phosphatase: 47 U/L (ref 40–115)
BUN: 8 mg/dL (ref 7–25)
CALCIUM: 9.3 mg/dL (ref 8.6–10.3)
CO2: 24 mmol/L (ref 20–31)
Chloride: 103 mmol/L (ref 98–110)
Creat: 1.02 mg/dL (ref 0.60–1.35)
Glucose, Bld: 73 mg/dL (ref 65–99)
Potassium: 3.8 mmol/L (ref 3.5–5.3)
Sodium: 139 mmol/L (ref 135–146)
TOTAL PROTEIN: 7.2 g/dL (ref 6.1–8.1)
Total Bilirubin: 1 mg/dL (ref 0.2–1.2)

## 2016-01-02 LAB — LIPID PANEL
Cholesterol: 156 mg/dL (ref 125–200)
HDL: 49 mg/dL (ref >=40)
LDL Cholesterol: 92 mg/dL (ref <130)
TRIGLYCERIDES: 77 mg/dL (ref <150)
Total CHOL/HDL Ratio: 3.2 Ratio (ref <=5.0)
VLDL: 15 mg/dL (ref <30)

## 2016-01-02 LAB — CBC
HCT: 47.4 % (ref 39.0–52.0)
HEMOGLOBIN: 16.5 g/dL (ref 13.0–17.0)
MCH: 31 pg (ref 26.0–34.0)
MCHC: 34.8 g/dL (ref 30.0–36.0)
MCV: 89.1 fL (ref 78.0–100.0)
MPV: 9.8 fL (ref 8.6–12.4)
Platelets: 182 10*3/uL (ref 150–400)
RBC: 5.32 MIL/uL (ref 4.22–5.81)
RDW: 13.4 % (ref 11.5–15.5)
WBC: 7.4 10*3/uL (ref 4.0–10.5)

## 2016-01-02 LAB — URIC ACID: Uric Acid, Serum: 7.7 mg/dL (ref 4.0–7.8)

## 2016-01-02 MED ORDER — AMLODIPINE BESYLATE 5 MG PO TABS: 5.0000 mg | ORAL_TABLET | Freq: Every day | ORAL | Status: DC

## 2016-01-02 MED ORDER — INDOMETHACIN 50 MG PO CAPS: 50.0000 mg | ORAL_CAPSULE | Freq: Two times a day (BID) | ORAL | Status: DC

## 2016-01-02 MED ORDER — FLUTICASONE-SALMETEROL 45-21 MCG/ACT IN AERO: 2.0000 | INHALATION_SPRAY | Freq: Two times a day (BID) | RESPIRATORY_TRACT | Status: DC

## 2016-01-02 MED ORDER — ALBUTEROL SULFATE HFA 108 (90 BASE) MCG/ACT IN AERS: 2.0000 | INHALATION_SPRAY | Freq: Four times a day (QID) | RESPIRATORY_TRACT | Status: DC | PRN

## 2016-01-02 MED ORDER — MOMETASONE FUROATE 50 MCG/ACT NA SUSP: 2.0000 | Freq: Every day | NASAL | Status: DC

## 2016-01-02 NOTE — Progress Notes (Signed)
   Christopher Hahn  MRN: [id number] DOB: 09/10/1976  Subjective:  Pt presents to clinic for medication refill.  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  He would like to switch to an advair inhaler - he uses his diskus once a day and with that he uses him albuterol inhaler less than 3x/year - he tried to stop it all together but he started wheezing again.  Pt is also thinking that he might have gout.  His left 2nd MTP has been red and swollen and painful to even light touch over the last several days.  He has NKI and he has never had this before.  135/85 - typically at home  Patient Active Problem List   Diagnosis Date Noted  . Hypertension 07/14/2012  . Asthma 07/14/2012  . Hyperlipidemia 07/14/2012    No current outpatient prescriptions on file prior to visit.   No current facility-administered medications on file prior to visit.    Allergies  Allergen Reactions  . Penicillins Anaphylaxis    Review of Systems  Respiratory: Negative for cough and shortness of breath.   Cardiovascular: Negative for chest pain, palpitations and leg swelling.   Objective:  BP 130/90 mmHg  Pulse 87  Temp(Src) 97.9 F (36.6 C)  Resp 16  Ht 5\' 6"  (1.676 m)  Wt 237 lb (107.502 kg)  BMI 38.27 kg/m2  Physical Exam  Constitutional: He is oriented to person, place, and time and well-developed, well-nourished, and in no distress.  HENT:  Head: Normocephalic and atraumatic.  Right Ear: External ear normal.  Left Ear: External ear normal.  Eyes: Conjunctivae are normal.  Neck: Normal range of motion.  Cardiovascular: Normal rate, regular rhythm and normal heart sounds.   No murmur heard. Pulmonary/Chest: Effort normal and breath sounds normal. He has no wheezes.  Musculoskeletal:  Left foot - 2nd MTP swollen and erythematous and warm on the skin surface and TTP  Neurological: He is  alert and oriented to person, place, and time. Gait normal.  Skin: Skin is warm and dry.  Psychiatric: Mood, memory, affect and judgment normal.    Assessment and Plan :  Asthma, mild intermittent, uncomplicated - Plan: albuterol (PROVENTIL HFA;VENTOLIN HFA) 108 (90 Base) MCG/ACT inhaler, COMPLETE METABOLIC PANEL WITH GFR, Lipid panel, fluticasone-salmeterol (ADVAIR HFA) 45-21 MCG/ACT inhaler, mometasone (NASONEX) 50 MCG/ACT nasal spray  Hyperlipidemia - Plan: pravastatin (PRAVACHOL) 20 MG tablet - continue current medications due to great cholesterol response to medication  Essential hypertension - Plan: amLODipine (NORVASC) 5 MG tablet - pt to  Monitor - slightly elevated today - if the diastolic stays high he will let me know and we will increase the Norvasc to 10mg   Pain of toe of left foot - Plan: Uric Acid, indomethacin (INDOCIN) 50 MG capsule - trial of this and if no better he will RTC for xray  Screening for HIV (human immunodeficiency virus) - Plan: HIV antibody  Screening for deficiency anemia - Plan: CBC  Need for Tdap vaccination - Plan: Tdap vaccine greater than or equal to 7yo IM - update today  Benny LennertSarah Weber PA-C  [facility] 01/04/2016 11:11 AM

## 2016-01-03 LAB — HIV ANTIBODY (ROUTINE TESTING W REFLEX): HIV 1&2 Ab, 4th Generation: NONREACTIVE

## 2016-01-04 MED ORDER — PRAVASTATIN SODIUM 20 MG PO TABS: 20.0000 mg | ORAL_TABLET | Freq: Every day | ORAL | Status: DC

## 2016-01-15 MED FILL — ADVAIR HFA 45-21 MCG INH: 45-21 | 90 days supply | Qty: 36 | Fill #0

## 2016-01-24 MED FILL — PRAVASTATIN NA 20 MG TAB: 20 | 90 days supply | Qty: 90 | Fill #0

## 2016-01-24 MED FILL — AMLODIPINE BESYLATE 5 MG TA: 5 | 90 days supply | Qty: 90 | Fill #0

## 2016-02-10 ENCOUNTER — Encounter: Payer: Self-pay | Admitting: Physician Assistant

## 2016-02-11 MED ORDER — EPINEPHRINE 0.3 MG/0.3ML IJ SOAJ: 0.3000 mg | Freq: Once | INTRAMUSCULAR | Status: AC

## 2016-04-24 MED FILL — AMLODIPINE BESYLATE 5 MG TA: 5 | 90 days supply | Qty: 90 | Fill #1

## 2016-04-24 MED FILL — EPINEPHRINE 0.3 MG AUTO-INJ: 0.3 | 30 days supply | Qty: 2 | Fill #0

## 2016-04-24 MED FILL — PRAVASTATIN NA 20 MG TAB: 20 | 90 days supply | Qty: 90 | Fill #1

## 2016-05-28 DIAGNOSIS — H5213 Myopia, bilateral: Secondary | ICD-10-CM | POA: Diagnosis not present

## 2016-05-28 DIAGNOSIS — H52223 Regular astigmatism, bilateral: Secondary | ICD-10-CM | POA: Diagnosis not present

## 2016-07-29 ENCOUNTER — Other Ambulatory Visit: Payer: Self-pay | Admitting: Physician Assistant

## 2016-07-29 DIAGNOSIS — I10 Essential (primary) hypertension: Secondary | ICD-10-CM

## 2016-07-29 MED FILL — PRAVASTATIN NA 20 MG TAB: 20 | 90 days supply | Qty: 90 | Fill #2

## 2016-07-29 MED FILL — AMLODIPINE BESYLATE 5 MG TA: 5 | 90 days supply | Qty: 90 | Fill #0

## 2016-10-30 MED FILL — PRAVASTATIN NA 20 MG TAB: 20 | 90 days supply | Qty: 90 | Fill #3

## 2016-11-03 ENCOUNTER — Ambulatory Visit (INDEPENDENT_AMBULATORY_CARE_PROVIDER_SITE_OTHER): Payer: 59 | Admitting: Physician Assistant

## 2016-11-03 ENCOUNTER — Encounter: Payer: Self-pay | Admitting: Physician Assistant

## 2016-11-03 VITALS — BP 132/84 | HR 105 | Temp 98.5°F | Resp 18 | Ht 66.0 in | Wt 244.0 lb

## 2016-11-03 DIAGNOSIS — I10 Essential (primary) hypertension: Secondary | ICD-10-CM

## 2016-11-03 DIAGNOSIS — E7800 Pure hypercholesterolemia, unspecified: Secondary | ICD-10-CM

## 2016-11-03 DIAGNOSIS — J4520 Mild intermittent asthma, uncomplicated: Secondary | ICD-10-CM | POA: Diagnosis not present

## 2016-11-03 MED ORDER — ALBUTEROL SULFATE HFA 108 (90 BASE) MCG/ACT IN AERS: 2.0000 | INHALATION_SPRAY | Freq: Four times a day (QID) | RESPIRATORY_TRACT | 2 refills | Status: AC | PRN

## 2016-11-03 MED ORDER — PRAVASTATIN SODIUM 20 MG PO TABS: 20.0000 mg | ORAL_TABLET | Freq: Every day | ORAL | 1 refills | Status: DC

## 2016-11-03 MED ORDER — FLUTICASONE-SALMETEROL 45-21 MCG/ACT IN AERO: 2.0000 | INHALATION_SPRAY | Freq: Two times a day (BID) | RESPIRATORY_TRACT | 3 refills | Status: DC

## 2016-11-03 MED ORDER — AMLODIPINE BESYLATE 5 MG PO TABS: 5.0000 mg | ORAL_TABLET | Freq: Every day | ORAL | 1 refills | Status: DC

## 2016-11-03 MED FILL — AMLODIPINE BESYLATE 5 MG TA: 5 | 90 days supply | Qty: 90 | Fill #0

## 2016-11-03 NOTE — Progress Notes (Signed)
   Christopher Hahn  MRN: [id number] DOB: 17-Jan-1976  Subjective:  Pt presents to clinic for medication refills.  He would also like a print out of his immunization for work.  Review of Systems  Respiratory: Negative for cough and shortness of breath.   Cardiovascular: Negative for chest pain, palpitations and leg swelling.    Patient Active Problem List   Diagnosis Date Noted  . Hypertension - no problems with medications - checks his BP at home 126/85, pulse 88 07/14/2012  . Asthma - uses albuterol activity outside esp around mold 07/14/2012  . Hyperlipidemia - no problems with medications - almost all meat is remove for his gout situation - yoga 5 days a week 07/14/2012    Current Outpatient Prescriptions on File Prior to Visit  Medication Sig Dispense Refill  . EPINEPHrine (EPIPEN 2-PAK) 0.3 mg/0.3 mL IJ SOAJ injection Inject 0.3 mLs (0.3 mg total) into the muscle once. (Patient not taking: Reported on 11/03/2016) 2 Device 1  . mometasone (NASONEX) 50 MCG/ACT nasal spray Place 2 sprays into the nose daily. (Patient not taking: Reported on 11/03/2016) 51 g 3   No current facility-administered medications on file prior to visit.     Allergies  Allergen Reactions  . Penicillins Anaphylaxis    Pt patients past, family and social history were reviewed and updated.   Objective:  BP 132/84 (BP Location: Right Arm, Patient Position: Sitting, Cuff Size: Large)   Pulse (!) 105   Temp 98.5 F (36.9 C) (Oral)   Resp 18   Ht 5\' 6"  (1.676 m)   Wt 244 lb (110.7 kg)   SpO2 98%   BMI 39.38 kg/m   Physical Exam  Constitutional: He is oriented to person, place, and time and well-developed, well-nourished, and in no distress.  HENT:  Head: Normocephalic and atraumatic.  Right Ear: External ear normal.  Left Ear: External ear normal.  Eyes: Conjunctivae are normal.  Neck: Normal range of motion.  Cardiovascular: Normal rate, regular rhythm, normal heart sounds and intact distal pulses.    Pulmonary/Chest: Effort normal and breath sounds normal. He has no wheezes.  Musculoskeletal:       Right lower leg: He exhibits no edema.       Left lower leg: He exhibits no edema.  Neurological: He is alert and oriented to person, place, and time. Gait normal.  Skin: Skin is warm and dry.  Psychiatric: Mood, memory, affect and judgment normal.    Assessment and Plan :  Mild intermittent asthma without complication - Plan: fluticasone-salmeterol (ADVAIR HFA) 45-21 MCG/ACT inhaler, albuterol (PROVENTIL HFA;VENTOLIN HFA) 108 (90 Base) MCG/ACT inhaler  Pure hypercholesterolemia - Plan: pravastatin (PRAVACHOL) 20 MG tablet  Essential hypertension - Plan: amLODipine (NORVASC) 5 MG tablet   Recheck in 6 months for his CPE.  Benny LennertSarah Raechel Marcos PA-C  [facility] 11/03/2016 2:55 PM

## 2016-11-03 NOTE — Patient Instructions (Signed)
     IF you received an x-ray today, you will receive an invoice from [facility]. Please contact [facility] at [phone] with questions or concerns regarding your invoice.   IF you received labwork today, you will receive an invoice from [facility]. Please contact [facility] at [phone] with questions or concerns regarding your invoice.   Our billing staff will not be able to assist you with questions regarding bills from these companies.  You will be contacted with the lab results as soon as they are available. The fastest way to get your results is to activate your My Chart account. Instructions are located on the last page of this paperwork. If you have not heard from us regarding the results in 2 weeks, please contact this office.     

## 2017-01-19 MED FILL — PRAVASTATIN NA 20 MG TAB: 20 | 90 days supply | Qty: 90 | Fill #0

## 2017-01-19 MED FILL — ADVAIR HFA 45-21 MCG INH: 45-21 | 90 days supply | Qty: 36 | Fill #0 | Status: TO

## 2017-01-19 MED FILL — AMLODIPINE BESYLATE 5 MG TA: 5 | 90 days supply | Qty: 90 | Fill #1

## 2017-04-19 ENCOUNTER — Other Ambulatory Visit: Payer: Self-pay | Admitting: Physician Assistant

## 2017-04-19 DIAGNOSIS — I10 Essential (primary) hypertension: Secondary | ICD-10-CM

## 2017-04-19 MED FILL — PRAVASTATIN NA 20 MG TAB: 20 | 90 days supply | Qty: 90 | Fill #1

## 2017-04-20 MED FILL — AMLODIPINE BESYLATE 5 MG TA: 5 | 30 days supply | Qty: 30 | Fill #0

## 2017-04-20 NOTE — Telephone Encounter (Signed)
mychart message sent to pt about making an appt °

## 2017-04-27 ENCOUNTER — Ambulatory Visit (INDEPENDENT_AMBULATORY_CARE_PROVIDER_SITE_OTHER): Payer: 59 | Admitting: Physician Assistant

## 2017-04-27 ENCOUNTER — Encounter: Payer: Self-pay | Admitting: Physician Assistant

## 2017-04-27 VITALS — BP 135/92 | HR 82 | Temp 98.3°F | Resp 18 | Ht 67.0 in | Wt 234.4 lb

## 2017-04-27 DIAGNOSIS — Z0000 Encounter for general adult medical examination without abnormal findings: Secondary | ICD-10-CM | POA: Diagnosis not present

## 2017-04-27 DIAGNOSIS — I10 Essential (primary) hypertension: Secondary | ICD-10-CM

## 2017-04-27 DIAGNOSIS — J4520 Mild intermittent asthma, uncomplicated: Secondary | ICD-10-CM | POA: Diagnosis not present

## 2017-04-27 DIAGNOSIS — E7800 Pure hypercholesterolemia, unspecified: Secondary | ICD-10-CM | POA: Diagnosis not present

## 2017-04-27 DIAGNOSIS — Z130 Encounter for screening for diseases of the blood and blood-forming organs and certain disorders involving the immune mechanism: Secondary | ICD-10-CM | POA: Diagnosis not present

## 2017-04-27 MED ORDER — FLUTICASONE-SALMETEROL 45-21 MCG/ACT IN AERO: 2.0000 | INHALATION_SPRAY | Freq: Two times a day (BID) | RESPIRATORY_TRACT | 3 refills | Status: DC

## 2017-04-27 MED ORDER — PRAVASTATIN SODIUM 20 MG PO TABS: 20.0000 mg | ORAL_TABLET | Freq: Every day | ORAL | 1 refills | Status: DC

## 2017-04-27 MED ORDER — AMLODIPINE BESYLATE 5 MG PO TABS: 5.0000 mg | ORAL_TABLET | Freq: Every day | ORAL | 1 refills | Status: DC

## 2017-04-27 MED ORDER — MOMETASONE FUROATE 50 MCG/ACT NA SUSP: 2.0000 | Freq: Every day | NASAL | 3 refills | Status: AC

## 2017-04-27 NOTE — Patient Instructions (Addendum)
  Monitor BP at home and let me know the readings.    IF you received an x-ray today, you will receive an invoice from [facility]. Please contact [facility] at [phone] with questions or concerns regarding your invoice.   IF you received labwork today, you will receive an invoice from [facility]. Please contact [facility] at [phone] with questions or concerns regarding your invoice.   Our billing staff will not be able to assist you with questions regarding bills from these companies.  You will be contacted with the lab results as soon as they are available. The fastest way to get your results is to activate your My Chart account. Instructions are located on the last page of this paperwork. If you have not heard from us regarding the results in 2 weeks, please contact this office.

## 2017-04-27 NOTE — Progress Notes (Signed)
Christopher Hahn  MRN: [id number] DOB: 1976-04-30  PCP: Mancel Bale, PA-C  Subjective:  Pt presents to clinic for a CPE.  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 Hypertension 07/14/2012  . Asthma 07/14/2012  . Hyperlipidemia 07/14/2012    Review of Systems  Constitutional: Negative.   HENT: Negative.   Eyes: Negative.   Respiratory: Negative.   Cardiovascular: Negative.   Gastrointestinal: Negative.   Endocrine: Negative.   Genitourinary: Negative.   Musculoskeletal: Negative.   Skin: Negative.   Allergic/Immunologic: Negative.   Neurological: Negative.   Hematological: Negative.   Psychiatric/Behavioral: Negative.      Current Outpatient Prescriptions on File Prior to Visit  Medication Sig Dispense Refill  . albuterol (PROVENTIL HFA;VENTOLIN HFA) 108 (90 Base) MCG/ACT inhaler Inhale 2 puffs into the lungs every 6 (six) hours as needed. 1 Inhaler 2  . EPINEPHrine (EPIPEN 2-PAK) 0.3 mg/0.3 mL IJ SOAJ injection Inject 0.3 mLs (0.3 mg total) into the muscle once. 2 Device 1   No current facility-administered medications on file prior to visit.     Allergies  Allergen Reactions  . Penicillins Anaphylaxis    Social History   Social History  . Marital status: Single    Spouse name: N/A  . Number of children: N/A  . Years of education: N/A   Social History Main Topics  . Smoking status: Never Smoker  . Smokeless tobacco: Never Used  . Alcohol use Yes     Comment: 1-2 drinks/weel  . Drug  use: No  . Sexual activity: Yes    Partners: Female   Other Topics Concern  . 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  Family History  Problem Relation Age of Onset  . Heart disease Father   . Hyperlipidemia Father   . Hypertension Father   . Hypertension Brother      Objective:  BP (!) 135/92   Pulse 82   Temp 98.3 F (36.8 C) (Oral)   Resp 18   Ht 5' 7"  (1.702 m)   Wt 234 lb 6.4 oz (106.3 kg)   SpO2 98%   BMI 36.71 kg/m   Physical Exam  Constitutional: He is oriented to person, place, and time and well-developed, well-nourished, and in no distress.  HENT:  Head: Normocephalic and atraumatic.  Right Ear: Hearing, tympanic membrane, external ear and ear canal normal.  Left Ear: Hearing, tympanic membrane, external ear and ear canal normal.  Nose: Nose normal.  Mouth/Throat: Uvula is midline, oropharynx is clear and moist and mucous membranes are normal.  Eyes: Conjunctivae and EOM are normal. Pupils are equal, round, and reactive to light.  Neck: Trachea normal and normal range of motion. Neck supple. No thyroid  mass and no thyromegaly present.  Cardiovascular: Normal rate, regular rhythm and normal heart sounds.   No murmur heard. Pulmonary/Chest: Effort normal and breath sounds normal.  Abdominal: Soft. Bowel sounds are normal. Hernia confirmed negative in the right inguinal area and confirmed negative in the left inguinal area.  Musculoskeletal: Normal range of motion.  Neurological: He is alert and oriented to person, place, and time. Gait normal.  Skin: Skin is warm and dry.  Psychiatric: Mood, memory, affect and judgment normal.    Wt Readings from Last 3 Encounters:  04/27/17 234 lb 6.4 oz (106.3 kg)  11/03/16 244 lb (110.7 kg)  01/02/16 237 lb (107.5 kg)     Visual Acuity Screening   Right eye  Left eye Both eyes  Without correction:     With correction: 20/15-1 20/15-1 20/15   Rhythm: NSR at a rate of 81. Findings: no acute changes Last EKG: never I have personally reviewed the EKG tracing and agree with the computerized printout.  Assessment and Plan :  Annual physical exam  Pure hypercholesterolemia - Plan: Lipid panel, pravastatin (PRAVACHOL) 20 MG tablet, EKG 12-Lead  Essential hypertension - Plan: CMP14+EGFR, amLODipine (NORVASC) 5 MG tablet, EKG 12-Lead  Mild intermittent asthma without complication - Plan: mometasone (NASONEX) 50 MCG/ACT nasal spray, fluticasone-salmeterol (ADVAIR HFA) 45-21 MCG/ACT inhaler  Screening for deficiency anemia - Plan: CBC with Differential/Platelet   Check labs continue medications - pt to monitor BP at home and let me know the results - I do not want to increase his medications based on the office readings with his home readings.  When he goes to school he needs to make sure he continue to monitor to make sure the dose does not have to be increased.  Recheck in about 6 months.  Windell Hummingbird PA-C  [facility] 04/27/2017 8:32 AM

## 2017-04-28 LAB — CMP14+EGFR
ALK PHOS: 65 IU/L (ref 39–117)
ALT: 44 IU/L (ref 0–44)
AST: 33 IU/L (ref 0–40)
Albumin/Globulin Ratio: 2 (ref 1.2–2.2)
Albumin: 4.5 g/dL (ref 3.5–5.5)
BILIRUBIN TOTAL: 0.9 mg/dL (ref 0.0–1.2)
BUN / CREAT RATIO: 10 (ref 9–20)
BUN: 11 mg/dL (ref 6–24)
CHLORIDE: 103 mmol/L (ref 96–106)
CO2: 23 mmol/L (ref 20–29)
Calcium: 9.4 mg/dL (ref 8.7–10.2)
Creatinine, Ser: 1.07 mg/dL (ref 0.76–1.27)
GFR calc Af Amer: 99 mL/min/{1.73_m2} (ref >59)
GFR calc non Af Amer: 86 mL/min/{1.73_m2} (ref >59)
GLOBULIN, TOTAL: 2.3 g/dL (ref 1.5–4.5)
Glucose: 95 mg/dL (ref 65–99)
Potassium: 4.2 mmol/L (ref 3.5–5.2)
Sodium: 143 mmol/L (ref 134–144)
Total Protein: 6.8 g/dL (ref 6.0–8.5)

## 2017-04-28 LAB — CBC WITH DIFFERENTIAL/PLATELET
BASOS ABS: 0 10*3/uL (ref 0.0–0.2)
Basos: 1 %
EOS (ABSOLUTE): 0.2 10*3/uL (ref 0.0–0.4)
EOS: 4 %
Hematocrit: 48.3 % (ref 37.5–51.0)
Hemoglobin: 16.3 g/dL (ref 13.0–17.7)
IMMATURE GRANULOCYTES: 0 %
Immature Grans (Abs): 0 10*3/uL (ref 0.0–0.1)
Lymphocytes Absolute: 1.7 10*3/uL (ref 0.7–3.1)
Lymphs: 34 %
MCH: 31.3 pg (ref 26.6–33.0)
MCHC: 33.7 g/dL (ref 31.5–35.7)
MCV: 93 fL (ref 79–97)
MONOCYTES: 8 %
Monocytes Absolute: 0.4 10*3/uL (ref 0.1–0.9)
NEUTROS PCT: 53 %
Neutrophils Absolute: 2.6 10*3/uL (ref 1.4–7.0)
PLATELETS: 163 10*3/uL (ref 150–379)
RBC: 5.2 x10E6/uL (ref 4.14–5.80)
RDW: 13.3 % (ref 12.3–15.4)
WBC: 4.8 10*3/uL (ref 3.4–10.8)

## 2017-04-28 LAB — LIPID PANEL
CHOL/HDL RATIO: 3.3 ratio (ref 0.0–5.0)
Cholesterol, Total: 177 mg/dL (ref 100–199)
HDL: 53 mg/dL (ref >39)
LDL Calculated: 106 mg/dL — ABNORMAL HIGH (ref 0–99)
Triglycerides: 88 mg/dL (ref 0–149)
VLDL Cholesterol Cal: 18 mg/dL (ref 5–40)

## 2017-04-29 ENCOUNTER — Encounter: Payer: 59 | Admitting: Physician Assistant

## 2017-05-10 ENCOUNTER — Encounter: Payer: Self-pay | Admitting: Physician Assistant

## 2017-05-24 MED FILL — AMLODIPINE BESYLATE 5 MG TA: 5 | 30 days supply | Qty: 30 | Fill #1

## 2017-06-18 ENCOUNTER — Encounter: Payer: Self-pay | Admitting: Physician Assistant

## 2017-06-18 DIAGNOSIS — I10 Essential (primary) hypertension: Secondary | ICD-10-CM

## 2017-06-23 MED ORDER — AMLODIPINE BESYLATE 5 MG PO TABS: 5.0000 mg | ORAL_TABLET | Freq: Every day | ORAL | 1 refills | Status: DC

## 2017-08-02 DIAGNOSIS — Z23 Encounter for immunization: Secondary | ICD-10-CM | POA: Diagnosis not present

## 2018-01-04 ENCOUNTER — Ambulatory Visit (INDEPENDENT_AMBULATORY_CARE_PROVIDER_SITE_OTHER): Payer: BLUE CROSS/BLUE SHIELD | Admitting: Physician Assistant

## 2018-01-04 ENCOUNTER — Encounter: Payer: Self-pay | Admitting: Physician Assistant

## 2018-01-04 ENCOUNTER — Other Ambulatory Visit: Payer: Self-pay

## 2018-01-04 VITALS — BP 130/88 | HR 81 | Temp 98.0°F | Resp 18 | Ht 67.0 in | Wt 224.0 lb

## 2018-01-04 DIAGNOSIS — E7800 Pure hypercholesterolemia, unspecified: Secondary | ICD-10-CM

## 2018-01-04 DIAGNOSIS — I10 Essential (primary) hypertension: Secondary | ICD-10-CM | POA: Diagnosis not present

## 2018-01-04 MED ORDER — AMLODIPINE BESYLATE 5 MG PO TABS: 5.0000 mg | ORAL_TABLET | Freq: Every day | ORAL | 4 refills | Status: AC

## 2018-01-04 NOTE — Patient Instructions (Signed)
     IF you received an x-ray today, you will receive an invoice from [facility]. Please contact [facility] at [phone] with questions or concerns regarding your invoice.   IF you received labwork today, you will receive an invoice from [facility]. Please contact [facility] at [phone] with questions or concerns regarding your invoice.   Our billing staff will not be able to assist you with questions regarding bills from these companies.  You will be contacted with the lab results as soon as they are available. The fastest way to get your results is to activate your My Chart account. Instructions are located on the last page of this paperwork. If you have not heard from us regarding the results in 2 weeks, please contact this office.     

## 2018-01-04 NOTE — Progress Notes (Signed)
Subjective:    Patient ID: EDU ON, male    DOB: [birth year], 42 y.o.   MRN: [id number]   Chief Complaint  Patient presents with  . Medication Refill    norvasc     HPI   Patient presents for re-evaluation of hypertension and hyperlipidemia. His blood pressure today was 130/88 mmHg. Patient takes blood pressure at home around once a week and it averages 125/85 mm Hg. Patient walks 5.5 miles per day as exercise. Patient is in his first year at [employer].   Last lipid panel was on 04/27/2017, LDL was 106 mg/dL at the time.   Patient reports to reducing meat intake to around once a week, and increasing his vegetable intake to two meals daily. Patient has lost 10 lbs since his last visit in 04/27/17.  Patient Active Problem List   Diagnosis Date Noted  . Hypertension 07/14/2012  . Asthma 07/14/2012  . Hyperlipidemia 07/14/2012   Allergies  Allergen Reactions  . Penicillins Anaphylaxis   Prior to Admission medications   Medication Sig Start Date End Date Taking? Authorizing Provider  albuterol (PROVENTIL HFA;VENTOLIN HFA) 108 (90 Base) MCG/ACT inhaler Inhale 2 puffs into the lungs every 6 (six) hours as needed. 11/03/16  Yes Quiana Cobaugh L, PA-C  amLODipine (NORVASC) 5 MG tablet Take 1 tablet (5 mg total) by mouth daily. 06/23/17  Yes Kinnedy Mongiello, Damaris Hippo, PA-C  EPINEPHrine (EPIPEN 2-PAK) 0.3 mg/0.3 mL IJ SOAJ injection Inject 0.3 mLs (0.3 mg total) into the muscle once. 02/11/16  Yes Shaquille Janes, Damaris Hippo, PA-C  fluticasone-salmeterol (ADVAIR HFA) [phone] MCG/ACT inhaler Inhale 2 puffs into the lungs 2 (two) times daily. 04/27/17  Yes Dodger Sinning L, PA-C  mometasone (NASONEX) 50 MCG/ACT nasal spray Place 2 sprays into the nose daily. 04/27/17  Yes Tammera Engert L, PA-C  pravastatin (PRAVACHOL) 20 MG tablet Take 1 tablet (20 mg total) by mouth daily. 04/27/17  Yes Adrieana Fennelly, Damaris Hippo, PA-C    Past Medical History:  Diagnosis Date  . Allergy   . Asthma   . Hyperlipidemia   . Hypertension     Social History   Socioeconomic History  . Marital status: Married    Spouse name: Sylvan   . Number of children: Not on file  . Years of education: Not on file  . Highest education level: Not on file  Social Needs  . Financial resource strain: Not on file  . Food insecurity - worry: Not on file  . Food insecurity - inability: Not on file  . Transportation needs - medical: Not on file  . Transportation needs - non-medical: Not on file  Occupational History  . Occupation: Ship broker  Tobacco Use  . Smoking status: Never Smoker  . Smokeless tobacco: Never Used  Substance and Sexual Activity  . Alcohol use: Yes    Comment: 1-2 drinks/week  . Drug use: No  . Sexual activity: Yes    Partners: Female  Other Topics Concern  . 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 Heart disease Father   . Hyperlipidemia Father   . Hypertension Father   . Hypertension Brother    History reviewed. No pertinent surgical history.  Review of Systems  Constitutional: Positive for activity change (Much more  active.). Negative for appetite change, chills, fatigue and unexpected weight change.  HENT: Negative.  Negative for congestion, rhinorrhea and sinus pressure.   Eyes: Negative.  Negative for photophobia, pain and redness.  Respiratory: Negative.  Negative for cough, chest tightness and shortness of breath.   Cardiovascular: Negative.  Negative for chest pain and palpitations.  Gastrointestinal: Negative.  Negative for constipation, diarrhea, nausea and vomiting.  Endocrine: Negative.  Negative for polydipsia and polyuria.  Genitourinary: Negative.  Negative for difficulty urinating and dysuria.  Musculoskeletal: Negative.  Negative for arthralgias, joint swelling and myalgias.  Skin: Negative.  Negative for color change and  rash.  Allergic/Immunologic: Positive for environmental allergies.  Neurological: Negative.  Negative for dizziness, light-headedness, numbness and headaches.  Psychiatric/Behavioral: Negative for sleep disturbance. The patient is not nervous/anxious.        Objective:   Physical Exam  Constitutional: He is oriented to person, place, and time. He appears well-developed and well-nourished.  BP 130/88   Pulse 81   Temp 98 F (36.7 C) (Oral)   Resp 18   Ht 5' 7"  (1.702 m)   Wt 224 lb (101.6 kg)   SpO2 98%   BMI 35.08 kg/m   HENT:  Head: Normocephalic and atraumatic.  Right Ear: External ear normal.  Left Ear: External ear normal.  Nose: Nose normal.  Mouth/Throat: Oropharynx is clear and moist.  Eyes: Conjunctivae and EOM are normal. Pupils are equal, round, and reactive to light.  Neck: Normal range of motion. Neck supple.  Cardiovascular: Normal rate, regular rhythm, normal heart sounds and intact distal pulses. Exam reveals no gallop and no friction rub.  No murmur heard. Pulmonary/Chest: Effort normal and breath sounds normal.  Abdominal: Soft. Bowel sounds are normal.  Musculoskeletal: Normal range of motion.  Neurological: He is alert and oriented to person, place, and time. He has normal reflexes.  Skin: Skin is warm and dry. No rash noted. No erythema. No pallor.  Psychiatric: He has a normal mood and affect. His behavior is normal. Judgment and thought content normal.     Wt Readings from Last 3 Encounters:  01/04/18 224 lb (101.6 kg)  04/27/17 234 lb 6.4 oz (106.3 kg)  11/03/16 244 lb (110.7 kg)      Assessment & Plan:   1. Essential hypertension - amLODipine (NORVASC) 5 MG tablet; Take 1 tablet (5 mg total) by mouth daily.  Dispense: 90 tablet; Refill: 4 - well controlled -    2. Pure hypercholesterolemia Patient has increased his daily exercise and improved his diet since his his last visit on 04/27/2017. Patient is currently taking Pravastatin 20 mg PO  daily. Lipid panel was updated today. Possible medication changes dependant on lab results.   - CMP14+EGFR - Lipid panel   Follow up in one year for re-evaluation of hypertension and hypercholesterolemia.

## 2018-01-05 LAB — CMP14+EGFR
ALBUMIN: 4.5 g/dL (ref 3.5–5.5)
ALT: 35 IU/L (ref 0–44)
AST: 36 IU/L (ref 0–40)
Albumin/Globulin Ratio: 1.9 (ref 1.2–2.2)
Alkaline Phosphatase: 63 IU/L (ref 39–117)
BUN / CREAT RATIO: 12 (ref 9–20)
BUN: 12 mg/dL (ref 6–24)
Bilirubin Total: 1 mg/dL (ref 0.0–1.2)
CALCIUM: 9.4 mg/dL (ref 8.7–10.2)
CO2: 25 mmol/L (ref 20–29)
Chloride: 103 mmol/L (ref 96–106)
Creatinine, Ser: 0.99 mg/dL (ref 0.76–1.27)
GFR, EST AFRICAN AMERICAN: 108 mL/min/{1.73_m2} (ref >59)
GFR, EST NON AFRICAN AMERICAN: 94 mL/min/{1.73_m2} (ref >59)
GLUCOSE: 97 mg/dL (ref 65–99)
Globulin, Total: 2.4 g/dL (ref 1.5–4.5)
Potassium: 4.4 mmol/L (ref 3.5–5.2)
Sodium: 144 mmol/L (ref 134–144)
TOTAL PROTEIN: 6.9 g/dL (ref 6.0–8.5)

## 2018-01-05 LAB — LIPID PANEL
Chol/HDL Ratio: 3.1 ratio (ref 0.0–5.0)
Cholesterol, Total: 182 mg/dL (ref 100–199)
HDL: 59 mg/dL (ref >39)
LDL CALC: 106 mg/dL — AB (ref 0–99)
Triglycerides: 85 mg/dL (ref 0–149)
VLDL Cholesterol Cal: 17 mg/dL (ref 5–40)

## 2018-01-10 ENCOUNTER — Encounter: Payer: Self-pay | Admitting: Physician Assistant

## 2018-01-19 DIAGNOSIS — Z111 Encounter for screening for respiratory tuberculosis: Secondary | ICD-10-CM | POA: Diagnosis not present

## 2018-01-22 ENCOUNTER — Other Ambulatory Visit: Payer: Self-pay | Admitting: Physician Assistant

## 2018-01-22 DIAGNOSIS — E7800 Pure hypercholesterolemia, unspecified: Secondary | ICD-10-CM

## 2018-07-13 ENCOUNTER — Other Ambulatory Visit: Payer: Self-pay | Admitting: Physician Assistant

## 2018-07-13 DIAGNOSIS — J4520 Mild intermittent asthma, uncomplicated: Secondary | ICD-10-CM

## 2018-07-13 NOTE — Telephone Encounter (Signed)
Advair refill Last Refill:04/27/17 # 3 inhaler with 3 refills Last OV: 01/04/18 PCP: Dr. Leretha PolSantiago Pharmacy:[facility] [place]

## 2018-07-15 NOTE — Telephone Encounter (Signed)
Med refilled Please schedule patient to establish care as patient's previous pcp in longer with this practice. thanks 

## 2018-07-15 NOTE — Telephone Encounter (Signed)
Pt of Sarah,PA. pls advise

## 2018-07-21 ENCOUNTER — Other Ambulatory Visit: Payer: Self-pay | Admitting: Physician Assistant

## 2018-07-21 DIAGNOSIS — E7800 Pure hypercholesterolemia, unspecified: Secondary | ICD-10-CM

## 2018-07-21 NOTE — Telephone Encounter (Signed)
Refill of pravachol by S. Weber  LOV 01/04/18 S. Weber  [facility] 01/24/18  #90  1 refill   [id number] [place], [place] - [address] AT [facility]

## 2019-03-30 DIAGNOSIS — Z111 Encounter for screening for respiratory tuberculosis: Secondary | ICD-10-CM | POA: Diagnosis not present

## 2019-04-01 DIAGNOSIS — Z111 Encounter for screening for respiratory tuberculosis: Secondary | ICD-10-CM | POA: Diagnosis not present

## 2019-12-20 DIAGNOSIS — I10 Essential (primary) hypertension: Secondary | ICD-10-CM | POA: Diagnosis not present

## 2019-12-22 DIAGNOSIS — R7309 Other abnormal glucose: Secondary | ICD-10-CM | POA: Diagnosis not present

## 2020-01-01 DIAGNOSIS — Z111 Encounter for screening for respiratory tuberculosis: Secondary | ICD-10-CM | POA: Diagnosis not present

## 2020-01-03 DIAGNOSIS — Z111 Encounter for screening for respiratory tuberculosis: Secondary | ICD-10-CM | POA: Diagnosis not present
